# Patient Record
Sex: Female | Born: 1985 | Race: Black or African American | Hispanic: No | Marital: Single | Smoking: Current every day smoker
Health system: Southern US, Community
[De-identification: ages and names within clinical notes are randomized; demographics above are authoritative.]

## PROBLEM LIST (undated history)

## (undated) DIAGNOSIS — R0981 Nasal congestion: Secondary | ICD-10-CM

## (undated) DIAGNOSIS — I499 Cardiac arrhythmia, unspecified: Secondary | ICD-10-CM

## (undated) DIAGNOSIS — D241 Benign neoplasm of right breast: Secondary | ICD-10-CM

## (undated) DIAGNOSIS — Z87898 Personal history of other specified conditions: Secondary | ICD-10-CM

## (undated) DIAGNOSIS — R011 Cardiac murmur, unspecified: Secondary | ICD-10-CM

## (undated) HISTORY — PX: NO PAST SURGERIES: SHX2092

---

## 2014-02-09 ENCOUNTER — Other Ambulatory Visit: Payer: Self-pay | Admitting: Gynecology

## 2014-02-09 DIAGNOSIS — N6452 Nipple discharge: Secondary | ICD-10-CM

## 2014-02-24 ENCOUNTER — Other Ambulatory Visit: Payer: Self-pay | Admitting: Gynecology

## 2014-02-24 ENCOUNTER — Ambulatory Visit
Admission: RE | Admit: 2014-02-24 | Discharge: 2014-02-24 | Disposition: A | Discharge status: Home / Self Care | Payer: BC Managed Care – PPO | Source: Ambulatory Visit | Attending: Gynecology | Admitting: Gynecology

## 2014-02-24 DIAGNOSIS — N6452 Nipple discharge: Secondary | ICD-10-CM

## 2014-03-04 ENCOUNTER — Other Ambulatory Visit: Payer: Self-pay | Admitting: Gynecology

## 2014-03-04 ENCOUNTER — Ambulatory Visit
Admission: RE | Admit: 2014-03-04 | Discharge: 2014-03-04 | Disposition: A | Discharge status: Home / Self Care | Payer: BC Managed Care – PPO | Source: Ambulatory Visit | Attending: Gynecology | Admitting: Gynecology

## 2014-03-04 DIAGNOSIS — N6452 Nipple discharge: Secondary | ICD-10-CM

## 2014-03-18 DIAGNOSIS — D241 Benign neoplasm of right breast: Secondary | ICD-10-CM

## 2014-03-18 HISTORY — DX: Benign neoplasm of right breast: D24.1

## 2014-03-18 HISTORY — PX: BREAST EXCISIONAL BIOPSY: SUR124

## 2014-03-30 ENCOUNTER — Other Ambulatory Visit (INDEPENDENT_AMBULATORY_CARE_PROVIDER_SITE_OTHER): Payer: Self-pay | Admitting: General Surgery

## 2014-03-30 DIAGNOSIS — D241 Benign neoplasm of right breast: Secondary | ICD-10-CM

## 2014-04-07 ENCOUNTER — Other Ambulatory Visit (INDEPENDENT_AMBULATORY_CARE_PROVIDER_SITE_OTHER): Payer: Self-pay | Admitting: General Surgery

## 2014-04-07 DIAGNOSIS — D241 Benign neoplasm of right breast: Secondary | ICD-10-CM

## 2014-04-14 ENCOUNTER — Encounter (HOSPITAL_BASED_OUTPATIENT_CLINIC_OR_DEPARTMENT_OTHER): Payer: Self-pay | Admitting: *Deleted

## 2014-04-14 DIAGNOSIS — R0981 Nasal congestion: Secondary | ICD-10-CM

## 2014-04-14 HISTORY — DX: Nasal congestion: R09.81

## 2014-04-14 NOTE — Pre-Procedure Instructions (Signed)
To come for EKG 

## 2014-04-19 ENCOUNTER — Ambulatory Visit
Admission: RE | Admit: 2014-04-19 | Discharge: 2014-04-19 | Disposition: A | Discharge status: Home / Self Care | Payer: BLUE CROSS/BLUE SHIELD | Source: Ambulatory Visit | Attending: General Surgery | Admitting: General Surgery

## 2014-04-19 DIAGNOSIS — D241 Benign neoplasm of right breast: Secondary | ICD-10-CM

## 2014-04-20 ENCOUNTER — Encounter (HOSPITAL_BASED_OUTPATIENT_CLINIC_OR_DEPARTMENT_OTHER)
Admission: RE | Disposition: A | Discharge status: Home / Self Care | Payer: Self-pay | Source: Ambulatory Visit | Attending: General Surgery

## 2014-04-20 ENCOUNTER — Ambulatory Visit (HOSPITAL_BASED_OUTPATIENT_CLINIC_OR_DEPARTMENT_OTHER): Payer: BLUE CROSS/BLUE SHIELD | Admitting: Certified Registered"

## 2014-04-20 ENCOUNTER — Ambulatory Visit (HOSPITAL_BASED_OUTPATIENT_CLINIC_OR_DEPARTMENT_OTHER)
Admission: RE | Admit: 2014-04-20 | Discharge: 2014-04-20 | Disposition: A | Discharge status: Home / Self Care | Payer: BLUE CROSS/BLUE SHIELD | Source: Ambulatory Visit | Attending: General Surgery | Admitting: General Surgery

## 2014-04-20 ENCOUNTER — Ambulatory Visit
Admission: RE | Admit: 2014-04-20 | Discharge: 2014-04-20 | Disposition: A | Discharge status: Home / Self Care | Payer: BLUE CROSS/BLUE SHIELD | Source: Ambulatory Visit | Attending: General Surgery | Admitting: General Surgery

## 2014-04-20 ENCOUNTER — Encounter (HOSPITAL_BASED_OUTPATIENT_CLINIC_OR_DEPARTMENT_OTHER): Payer: Self-pay | Admitting: *Deleted

## 2014-04-20 DIAGNOSIS — G43909 Migraine, unspecified, not intractable, without status migrainosus: Secondary | ICD-10-CM | POA: Insufficient documentation

## 2014-04-20 DIAGNOSIS — D241 Benign neoplasm of right breast: Secondary | ICD-10-CM | POA: Insufficient documentation

## 2014-04-20 DIAGNOSIS — F17200 Nicotine dependence, unspecified, uncomplicated: Secondary | ICD-10-CM | POA: Diagnosis not present

## 2014-04-20 HISTORY — DX: Benign neoplasm of right breast: D24.1

## 2014-04-20 HISTORY — DX: Cardiac arrhythmia, unspecified: I49.9

## 2014-04-20 HISTORY — DX: Personal history of other specified conditions: Z87.898

## 2014-04-20 HISTORY — DX: Cardiac murmur, unspecified: R01.1

## 2014-04-20 HISTORY — DX: Nasal congestion: R09.81

## 2014-04-20 HISTORY — PX: BREAST SURGERY: SHX581

## 2014-04-20 HISTORY — PX: BREAST LUMPECTOMY WITH RADIOACTIVE SEED LOCALIZATION: SHX6424

## 2014-04-20 SURGERY — BREAST LUMPECTOMY WITH RADIOACTIVE SEED LOCALIZATION
Anesthesia: General | Site: Breast | Laterality: Right

## 2014-04-20 MED ORDER — ACETAMINOPHEN 325 MG PO TABS: 325.0000 mg | ORAL_TABLET | ORAL | Status: DC | PRN

## 2014-04-20 MED ORDER — CEFAZOLIN SODIUM-DEXTROSE 2-3 GM-% IV SOLR
2.0000 g | INTRAVENOUS | Status: AC
  Administered 2014-04-20: 2 g via INTRAVENOUS

## 2014-04-20 MED ORDER — MIDAZOLAM HCL 2 MG/2ML IJ SOLN: 1.0000 mg | INTRAMUSCULAR | Status: DC | PRN

## 2014-04-20 MED ORDER — BUPIVACAINE-EPINEPHRINE (PF) 0.25% -1:200000 IJ SOLN
INTRAMUSCULAR | Status: AC
  Filled 2014-04-20: qty 30

## 2014-04-20 MED ORDER — BUPIVACAINE HCL (PF) 0.25 % IJ SOLN
INTRAMUSCULAR | Status: AC
  Filled 2014-04-20: qty 30

## 2014-04-20 MED ORDER — CHLORHEXIDINE GLUCONATE 4 % EX LIQD: 1.0000 "application " | Freq: Once | CUTANEOUS | Status: DC

## 2014-04-20 MED ORDER — ONDANSETRON HCL 4 MG/2ML IJ SOLN
INTRAMUSCULAR | Status: DC | PRN
  Administered 2014-04-20: 4 mg via INTRAVENOUS

## 2014-04-20 MED ORDER — MIDAZOLAM HCL 2 MG/2ML IJ SOLN
INTRAMUSCULAR | Status: AC
  Filled 2014-04-20: qty 2

## 2014-04-20 MED ORDER — LACTATED RINGERS IV SOLN
INTRAVENOUS | Status: DC
  Administered 2014-04-20 (×2): via INTRAVENOUS

## 2014-04-20 MED ORDER — FENTANYL CITRATE 0.05 MG/ML IJ SOLN
INTRAMUSCULAR | Status: AC
  Filled 2014-04-20: qty 2

## 2014-04-20 MED ORDER — MIDAZOLAM HCL 5 MG/5ML IJ SOLN
INTRAMUSCULAR | Status: DC | PRN
  Administered 2014-04-20: 2 mg via INTRAVENOUS

## 2014-04-20 MED ORDER — OXYCODONE-ACETAMINOPHEN 5-325 MG PO TABS: 1.0000 | ORAL_TABLET | ORAL | Status: DC | PRN

## 2014-04-20 MED ORDER — PROPOFOL 10 MG/ML IV BOLUS
INTRAVENOUS | Status: DC | PRN
  Administered 2014-04-20: 40 mg via INTRAVENOUS
  Administered 2014-04-20: 140 mg via INTRAVENOUS

## 2014-04-20 MED ORDER — DEXAMETHASONE SODIUM PHOSPHATE 10 MG/ML IJ SOLN
INTRAMUSCULAR | Status: DC | PRN
  Administered 2014-04-20: 10 mg via INTRAVENOUS

## 2014-04-20 MED ORDER — ACETAMINOPHEN 160 MG/5ML PO SOLN: 325.0000 mg | ORAL | Status: DC | PRN

## 2014-04-20 MED ORDER — FENTANYL CITRATE 0.05 MG/ML IJ SOLN
INTRAMUSCULAR | Status: AC
  Filled 2014-04-20: qty 6

## 2014-04-20 MED ORDER — FENTANYL CITRATE 0.05 MG/ML IJ SOLN
25.0000 ug | INTRAMUSCULAR | Status: DC | PRN
  Administered 2014-04-20: 25 ug via INTRAVENOUS
  Administered 2014-04-20: 50 ug via INTRAVENOUS
  Administered 2014-04-20: 25 ug via INTRAVENOUS

## 2014-04-20 MED ORDER — MIDAZOLAM HCL 2 MG/ML PO SYRP: 12.0000 mg | ORAL_SOLUTION | Freq: Once | ORAL | Status: DC | PRN

## 2014-04-20 MED ORDER — OXYCODONE HCL 5 MG/5ML PO SOLN
5.0000 mg | Freq: Once | ORAL | Status: DC | PRN
Start: 2014-04-20 — End: 2014-04-20

## 2014-04-20 MED ORDER — BUPIVACAINE HCL (PF) 0.25 % IJ SOLN
INTRAMUSCULAR | Status: DC | PRN
  Administered 2014-04-20: 10 mL

## 2014-04-20 MED ORDER — CEFAZOLIN SODIUM 1-5 GM-% IV SOLN
INTRAVENOUS | Status: AC
  Filled 2014-04-20: qty 100

## 2014-04-20 MED ORDER — FENTANYL CITRATE 0.05 MG/ML IJ SOLN
INTRAMUSCULAR | Status: DC | PRN
  Administered 2014-04-20 (×2): 50 ug via INTRAVENOUS

## 2014-04-20 MED ORDER — OXYCODONE HCL 5 MG PO TABS: 5.0000 mg | ORAL_TABLET | Freq: Once | ORAL | Status: DC | PRN

## 2014-04-20 MED ORDER — LIDOCAINE HCL (CARDIAC) 20 MG/ML IV SOLN
INTRAVENOUS | Status: DC | PRN
  Administered 2014-04-20: 60 mg via INTRAVENOUS

## 2014-04-20 MED ORDER — FENTANYL CITRATE 0.05 MG/ML IJ SOLN: 50.0000 ug | INTRAMUSCULAR | Status: DC | PRN

## 2014-04-20 SURGICAL SUPPLY — 39 items
APPLIER CLIP 9.375 MED OPEN (MISCELLANEOUS) ×2
BLADE SURG 15 STRL LF DISP TIS (BLADE) ×1 IMPLANT
BLADE SURG 15 STRL SS (BLADE) ×1
CANISTER SUC SOCK COL 7IN (MISCELLANEOUS) IMPLANT
CANISTER SUCT 1200ML W/VALVE (MISCELLANEOUS) IMPLANT
CHLORAPREP W/TINT 26ML (MISCELLANEOUS) ×2 IMPLANT
CLIP APPLIE 9.375 MED OPEN (MISCELLANEOUS) ×1 IMPLANT
COVER BACK TABLE 60X90IN (DRAPES) ×2 IMPLANT
COVER MAYO STAND STRL (DRAPES) ×2 IMPLANT
COVER PROBE W GEL 5X96 (DRAPES) ×2 IMPLANT
DECANTER SPIKE VIAL GLASS SM (MISCELLANEOUS) IMPLANT
DEVICE DUBIN W/COMP PLATE 8390 (MISCELLANEOUS) ×2 IMPLANT
DRAPE LAPAROSCOPIC ABDOMINAL (DRAPES) ×2 IMPLANT
DRAPE UTILITY XL STRL (DRAPES) ×2 IMPLANT
ELECT COATED BLADE 2.86 ST (ELECTRODE) ×2 IMPLANT
ELECT REM PT RETURN 9FT ADLT (ELECTROSURGICAL) ×2
ELECTRODE REM PT RTRN 9FT ADLT (ELECTROSURGICAL) ×1 IMPLANT
GLOVE BIO SURGEON STRL SZ7.5 (GLOVE) ×4 IMPLANT
GLOVE BIOGEL PI IND STRL 7.0 (GLOVE) ×1 IMPLANT
GLOVE BIOGEL PI INDICATOR 7.0 (GLOVE) ×1
GLOVE ECLIPSE 6.5 STRL STRAW (GLOVE) ×2 IMPLANT
GOWN STRL REUS W/ TWL LRG LVL3 (GOWN DISPOSABLE) ×2 IMPLANT
GOWN STRL REUS W/TWL LRG LVL3 (GOWN DISPOSABLE) ×2
KIT MARKER MARGIN INK (KITS) IMPLANT
LIQUID BAND (GAUZE/BANDAGES/DRESSINGS) ×2 IMPLANT
NEEDLE HYPO 25X1 1.5 SAFETY (NEEDLE) ×2 IMPLANT
NS IRRIG 1000ML POUR BTL (IV SOLUTION) ×2 IMPLANT
PACK BASIN DAY SURGERY FS (CUSTOM PROCEDURE TRAY) ×2 IMPLANT
PENCIL BUTTON HOLSTER BLD 10FT (ELECTRODE) ×2 IMPLANT
SLEEVE SCD COMPRESS KNEE MED (MISCELLANEOUS) ×2 IMPLANT
SPONGE LAP 18X18 X RAY DECT (DISPOSABLE) ×2 IMPLANT
SUT MON AB 4-0 PC3 18 (SUTURE) ×2 IMPLANT
SUT SILK 2 0 SH (SUTURE) ×2 IMPLANT
SUT VICRYL 3-0 CR8 SH (SUTURE) ×2 IMPLANT
SYR CONTROL 10ML LL (SYRINGE) ×2 IMPLANT
TOWEL OR 17X24 6PK STRL BLUE (TOWEL DISPOSABLE) ×2 IMPLANT
TOWEL OR NON WOVEN STRL DISP B (DISPOSABLE) ×2 IMPLANT
TUBE CONNECTING 20X1/4 (TUBING) IMPLANT
YANKAUER SUCT BULB TIP NO VENT (SUCTIONS) IMPLANT

## 2014-04-20 NOTE — Interval H&P Note (Signed)
History and Physical Interval Note:  04/20/2014 7:57 AM  Allison Juarez  has presented today for surgery, with the diagnosis of Right Breast Papilloma  The various methods of treatment have been discussed with the patient and family. After consideration of risks, benefits and other options for treatment, the patient has consented to  Procedure(s): RIGHT BREAST LUMPECTOMY WITH RADIOACTIVE SEED LOCALIZATION (Right) as a surgical intervention .  The patient's history has been reviewed, patient examined, no change in status, stable for surgery.  I have reviewed the patient's chart and labs.  Questions were answered to the patient's satisfaction.     TOTH III,Fabricio Endsley S

## 2014-04-20 NOTE — Transfer of Care (Signed)
Immediate Anesthesia Transfer of Care Note  Patient: Allison Juarez  Procedure(s) Performed: Procedure(s): RIGHT BREAST LUMPECTOMY WITH RADIOACTIVE SEED LOCALIZATION (Right)  Patient Location: PACU  Anesthesia Type:General  Level of Consciousness: awake, alert , oriented and patient cooperative  Airway & Oxygen Therapy: Patient Spontanous Breathing and Patient connected to face mask oxygen  Post-op Assessment: Report given to RN and Post -op Vital signs reviewed and stable  Post vital signs: Reviewed and stable  Last Vitals:  Filed Vitals:   04/20/14 0929  BP:   Pulse: 89  Temp:   Resp:     Complications: No apparent anesthesia complications

## 2014-04-20 NOTE — Op Note (Signed)
04/20/2014  9:20 AM  PATIENT:  Allison Juarez  29 y.o. female  PRE-OPERATIVE DIAGNOSIS:  Right Breast Papilloma  POST-OPERATIVE DIAGNOSIS:  right breast papilloma  PROCEDURE:  Procedure(s): RIGHT BREAST LUMPECTOMY WITH RADIOACTIVE SEED LOCALIZATION (Right)  SURGEON:  Surgeon(s) and Role:    * Jovita Kussmaul, MD - Primary  PHYSICIAN ASSISTANT:   ASSISTANTS: none   ANESTHESIA:   general  EBL:  Total I/O In: 1000 [I.V.:1000] Out: -   BLOOD ADMINISTERED:none  DRAINS: none   LOCAL MEDICATIONS USED:  MARCAINE     SPECIMEN:  Source of Specimen:  right breast tissue  DISPOSITION OF SPECIMEN:  PATHOLOGY  COUNTS:  YES  TOURNIQUET:  * No tourniquets in log *  DICTATION: .Dragon Dictation  After informed consent was obtained the patient was brought to the operating room and placed in the supine position on the operating table. After adequate induction of general anesthesia the patient's right breast was prepped with ChloraPrep, allowed to dry, and draped in usual sterile manner. Previously a radioactive I-125 seed had been placed in the lower inner quadrant at the edge of the areola to mark an area of a papilloma. The neoprobe was set to I-125 and we were able to identify the area of radioactivity in the lower inner right breast. A curvilinear incision was made at the edge of the areola with a 15 blade knife. The incision was carried through the skin and subcutaneous tissue sharply with the electrocautery. The neoprobe was used to frequently checked the position of the radioactivity. A circular portion of breast tissue was excised sharply with electrocautery around the area of radioactivity. Once the specimen was removed it was oriented with a short single stitch on the superior surface, a long single stitch on the lateral surface, and a long double stitch on the anterior surface. Radioactivity was confirmed in the specimen. There was no residual radioactivity left in the breast. A specimen  radiograph was obtained that showed the clip and seed to be in the center of the specimen. The specimen was then sent to pathology for further evaluation. Hemostasis was achieved using the Bovie electrocautery. The wound was infiltrated with quarter percent Marcaine and irrigated with copious amounts of saline. The deep layer of the wound was closed with interrupted 3-0 Vicryl stitches. The skin was then closed with interrupted 4-0 Monocryl subcuticular stitches. Dermabond dressings were applied. The patient tolerated the procedure well. At the end of the case all needle sponge and instrument counts were correct. The patient was then awakened and taken to recovery in stable condition.  PLAN OF CARE: Discharge to home after PACU  PATIENT DISPOSITION:  PACU - hemodynamically stable.   Delay start of Pharmacological VTE agent (>24hrs) due to surgical blood loss or risk of bleeding: not applicable

## 2014-04-20 NOTE — Anesthesia Preprocedure Evaluation (Addendum)
Anesthesia Evaluation  Patient identified by MRN, date of birth, ID band Patient awake    Reviewed: Allergy & Precautions, NPO status , Patient's Chart, lab work & pertinent test results  History of Anesthesia Complications Negative for: history of anesthetic complications  Airway Mallampati: I  TM Distance: >3 FB Neck ROM: Full    Dental  (+) Teeth Intact   Pulmonary neg sleep apnea, neg COPDneg recent URI, Current Smoker,  breath sounds clear to auscultation        Cardiovascular negative cardio ROS  Rhythm:Regular     Neuro/Psych negative neurological ROS  negative psych ROS   GI/Hepatic negative GI ROS, Neg liver ROS,   Endo/Other  negative endocrine ROS  Renal/GU negative Renal ROS     Musculoskeletal negative musculoskeletal ROS (+)   Abdominal   Peds  Hematology negative hematology ROS (+)   Anesthesia Other Findings   Reproductive/Obstetrics                           Anesthesia Physical Anesthesia Plan  ASA: II  Anesthesia Plan: General   Post-op Pain Management:    Induction: Intravenous  Airway Management Planned: LMA  Additional Equipment: None  Intra-op Plan:   Post-operative Plan: Extubation in OR  Informed Consent: I have reviewed the patients History and Physical, chart, labs and discussed the procedure including the risks, benefits and alternatives for the proposed anesthesia with the patient or authorized representative who has indicated his/her understanding and acceptance.   Dental advisory given  Plan Discussed with: CRNA and Surgeon  Anesthesia Plan Comments:         Anesthesia Quick Evaluation

## 2014-04-20 NOTE — Anesthesia Postprocedure Evaluation (Signed)
  Anesthesia Post-op Note  Patient: Allison Juarez  Procedure(s) Performed: Procedure(s): RIGHT BREAST LUMPECTOMY WITH RADIOACTIVE SEED LOCALIZATION (Right)  Patient Location: PACU  Anesthesia Type: General   Level of Consciousness: awake, alert  and oriented  Airway and Oxygen Therapy: Patient Spontanous Breathing  Post-op Pain: mild  Post-op Assessment: Post-op Vital signs reviewed  Post-op Vital Signs: Reviewed  Last Vitals:  Filed Vitals:   04/20/14 1015  BP: 115/78  Pulse: 59  Temp:   Resp: 15    Complications: No apparent anesthesia complications

## 2014-04-20 NOTE — Discharge Instructions (Signed)

## 2014-04-20 NOTE — Anesthesia Procedure Notes (Addendum)
Procedure Name: LMA Insertion Performed by: Zechariah Bissonnette Pre-anesthesia Checklist: Patient identified, Emergency Drugs available, Suction available and Patient being monitored Patient Re-evaluated:Patient Re-evaluated prior to inductionOxygen Delivery Method: Circle System Utilized Preoxygenation: Pre-oxygenation with 100% oxygen Intubation Type: IV induction Ventilation: Mask ventilation without difficulty LMA: LMA inserted LMA Size: 4.0 Number of attempts: 1 Airway Equipment and Method: Bite block Placement Confirmation: positive ETCO2 Tube secured with: Tape Dental Injury: Teeth and Oropharynx as per pre-operative assessment

## 2014-04-20 NOTE — H&P (Signed)
Allison Juarez 03/30/2014 2:07 PM Location: [facility] Patient #: [id number] DOB: May 04, 1985 Married / Language: English / Race: Black or African American Female  History of Present Illness Sammuel Hines. Marlou Starks MD; 03/30/2014 2:33 PM) Patient words: breast papiloma.  The patient is a 29 year old female who presents with a breast mass. We are asked to see the patient in consultation by Dr. Pamelia Hoit to evaluate her for a papilloma of the right breast. The patient is a 29 year old black female who has been experiencing bloody nipple discharge from the right breast for the last 10 months. The discharge occurs spontaneously on a daily basis. She denies any breast pain. She does not take any female hormones. She does have a history of breast cancer in some aunts. She was evaluated with ultrasound that did show an intraductal papilloma measuring about 5-6 mm in the 3 o'clock position of the right breast   Other Problems Briant Cedar, CMA; 03/30/2014 2:07 PM) Heart murmur Migraine Headache Other disease, cancer, significant illness  Past Surgical History Briant Cedar, [place]; 03/30/2014 2:07 PM) Oral Surgery  Diagnostic Studies History Briant Cedar, CMA; 03/30/2014 2:07 PM) Colonoscopy never Mammogram within last year Pap Smear 1-5 years ago  Allergies Briant Cedar, [place]; 03/30/2014 2:10 PM) No Known Drug Allergies01/13/2016  Medication History Briant Cedar, [place]; 03/30/2014 2:10 PM) No Current Medications  Social History Briant Cedar, [place]; 03/30/2014 2:07 PM) Alcohol use Occasional alcohol use. Caffeine use Coffee. Illicit drug use Prefer to discuss with provider. Tobacco use Current some day smoker.  Family History Briant Cedar, [place]; 03/30/2014 2:07 PM) Alcohol Abuse Father, Mother. Arthritis Family Members In General. Breast Cancer Family Members In General. Diabetes Mellitus Father. Heart Disease Mother. Heart disease in female  family member before age 66 Hypertension Father, Mother. Migraine Headache Mother. Respiratory Condition Family Members In General.  Pregnancy / Birth History Briant Cedar, [place]; 03/30/2014 2:07 PM) Age at menarche 19 years. Gravida 0 Para 0 Regular periods  Review of Systems Briant Cedar CMA; 03/30/2014 2:07 PM) General Present- Fever and Night Sweats. Not Present- Appetite Loss, Chills, Fatigue, Weight Gain and Weight Loss. HEENT Present- Seasonal Allergies and Wears glasses/contact lenses. Not Present- Earache, Hearing Loss, Hoarseness, Nose Bleed, Oral Ulcers, Ringing in the Ears, Sinus Pain, Sore Throat, Visual Disturbances and Yellow Eyes. Respiratory Not Present- Bloody sputum, Chronic Cough, Difficulty Breathing, Snoring and Wheezing. Breast Present- Nipple Discharge. Not Present- Breast Mass, Breast Pain and Skin Changes. Cardiovascular Present- Rapid Heart Rate. Not Present- Chest Pain, Difficulty Breathing Lying Down, Leg Cramps, Palpitations, Shortness of Breath and Swelling of Extremities. Gastrointestinal Present- Change in Bowel Habits. Not Present- Abdominal Pain, Bloating, Bloody Stool, Chronic diarrhea, Constipation, Difficulty Swallowing, Excessive gas, Gets full quickly at meals, Hemorrhoids, Indigestion, Nausea, Rectal Pain and Vomiting. Female Genitourinary Not Present- Frequency, Nocturia, Painful Urination, Pelvic Pain and Urgency. Musculoskeletal Present- Joint Pain. Not Present- Back Pain, Joint Stiffness, Muscle Pain, Muscle Weakness and Swelling of Extremities. Neurological Present- Fainting. Not Present- Decreased Memory, Headaches, Numbness, Seizures, Tingling, Tremor, Trouble walking and Weakness. Psychiatric Not Present- Anxiety, Bipolar, Change in Sleep Pattern, Depression, Fearful and Frequent crying. Hematology Not Present- Easy Bruising, Excessive bleeding, Gland problems, HIV and Persistent Infections.   Vitals Briant Cedar CMA; 03/30/2014  2:11 PM) 03/30/2014 2:10 PM Weight: 127 lb Height: 63in Body Surface Area: 1.6 m Body Mass Index: 22.5 kg/m Temp.: 61F  Pulse: 94 (Regular)  BP: 102/70 (Sitting, Left Arm, Standard)    Physical Exam Eddie Dibbles S. Marlou Starks MD;  03/30/2014 2:34 PM) General Mental Status-Alert. General Appearance-Consistent with stated age. Hydration-Well hydrated. Voice-Normal.  Head and Neck Head-normocephalic, atraumatic with no lesions or palpable masses. Trachea-midline. Thyroid Gland Characteristics - normal size and consistency.  Eye Eyeball - Bilateral-Extraocular movements intact. Sclera/Conjunctiva - Bilateral-No scleral icterus.  Chest and Lung Exam Chest and lung exam reveals -quiet, even and easy respiratory effort with no use of accessory muscles and on auscultation, normal breath sounds, no adventitious sounds and normal vocal resonance. Inspection Chest Wall - Normal. Back - normal.  Breast Breast - Left-Symmetric, Non Tender, No Biopsy scars, no Dimpling, No Inflammation, No Lumpectomy scars, No Mastectomy scars, No Peau d' Orange. Breast - Right-Symmetric, Non Tender, No Biopsy scars, no Dimpling, No Inflammation, No Lumpectomy scars, No Mastectomy scars, No Peau d' Orange. Breast Lump-No Palpable Breast Mass.  Cardiovascular Cardiovascular examination reveals -normal heart sounds, regular rate and rhythm with no murmurs and normal pedal pulses bilaterally.  Abdomen Inspection Inspection of the abdomen reveals - No Hernias. Skin - Scar - no surgical scars. Palpation/Percussion Palpation and Percussion of the abdomen reveal - Soft, Non Tender, No Rebound tenderness, No Rigidity (guarding) and No hepatosplenomegaly. Auscultation Auscultation of the abdomen reveals - Bowel sounds normal.  Neurologic Neurologic evaluation reveals -alert and oriented x 3 with no impairment of recent or remote memory. Mental  Status-Normal.  Musculoskeletal Normal Exam - Left-Upper Extremity Strength Normal and Lower Extremity Strength Normal. Normal Exam - Right-Upper Extremity Strength Normal and Lower Extremity Strength Normal.  Lymphatic Head & Neck  General Head & Neck Lymphatics: Bilateral - Description - Normal. Axillary  General Axillary Region: Bilateral - Description - Normal. Tenderness - Non Tender. Femoral & Inguinal  Generalized Femoral & Inguinal Lymphatics: Bilateral - Description - Normal. Tenderness - Non Tender.    Assessment & Plan Eddie Dibbles S. 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 Because of this can increase the risk of breast cancer during her lifetime and because of the abnormal growth within the duct and the bloody discharge, the recommendation would be to have this area of papilloma removed. I have discussed with her in detail the risks and benefits of the operation to remove the papilloma as well as some of the technical aspects and she understands and wishes to proceed. Plan for radioactive seed localized lumpectomy of the right breast     Signed by Luella Cook, MD (03/30/2014 2:34 PM)

## 2014-04-21 ENCOUNTER — Encounter (HOSPITAL_BASED_OUTPATIENT_CLINIC_OR_DEPARTMENT_OTHER): Payer: Self-pay | Admitting: General Surgery

## 2015-06-01 IMAGING — MG MM RT RADIOACTIVE SEED LOC MAMMO GUIDE
8 of 10 series · 8 of 10 positions shown · non-contrast
Comparison: Previous exam(s).

CLINICAL DATA: Patient with prior biopsy right breast demonstrating
intraductal papilloma. For preoperative localization prior to
surgical excision.

EXAM:
MAMMOGRAPHIC GUIDED RADIOACTIVE SEED LOCALIZATION OF THE RIGHT
BREAST

[R ML (1 of 5)]
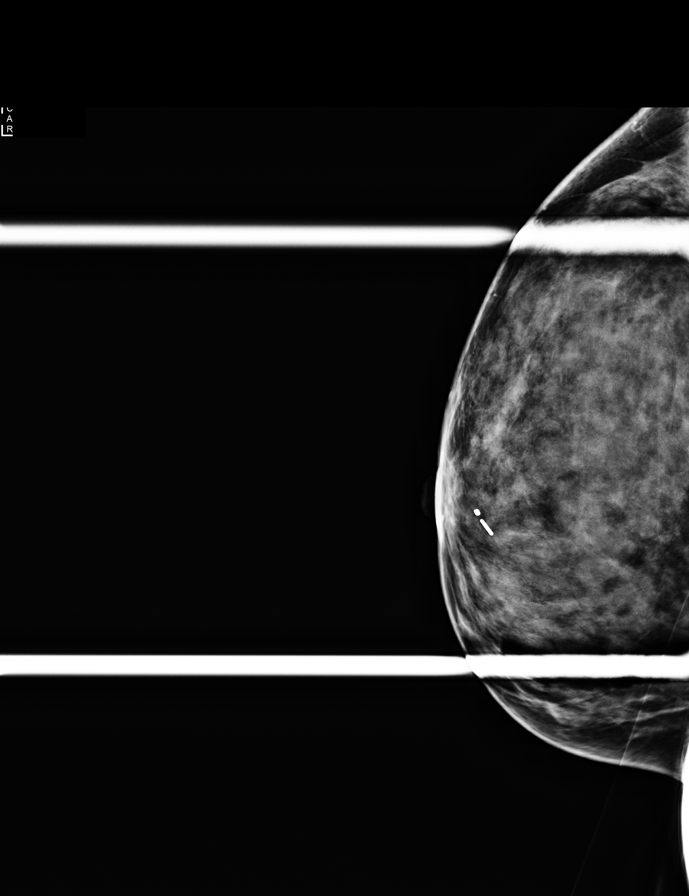

[R CC (1 of 3)]
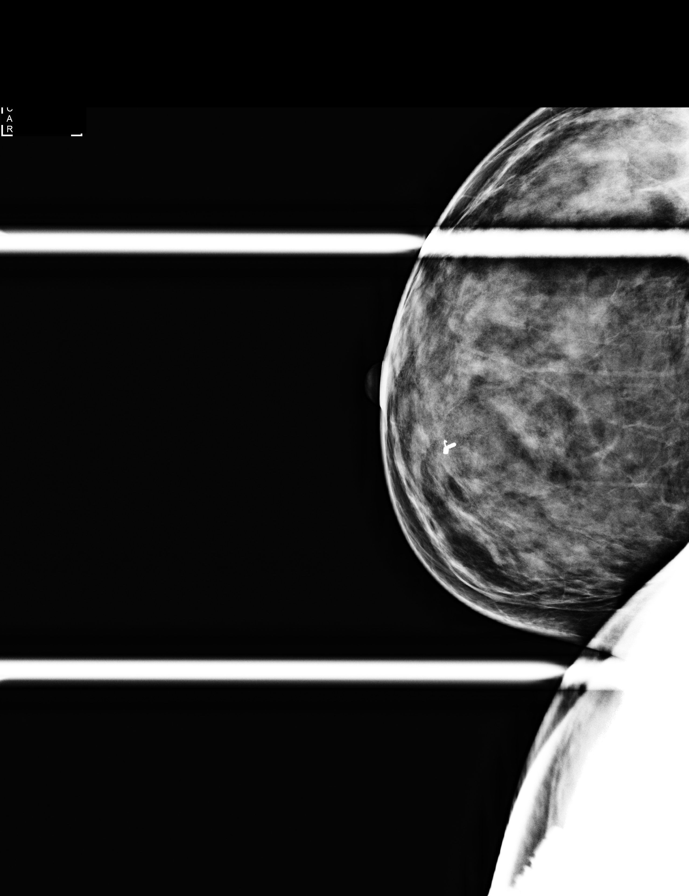

[R ML (2 of 5)]
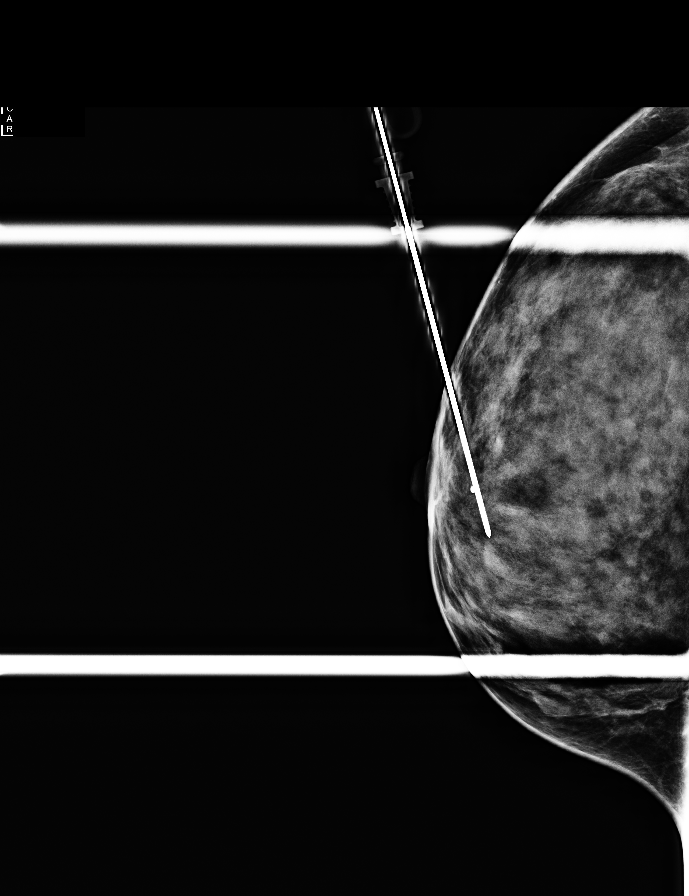

[R ML (3 of 5)]
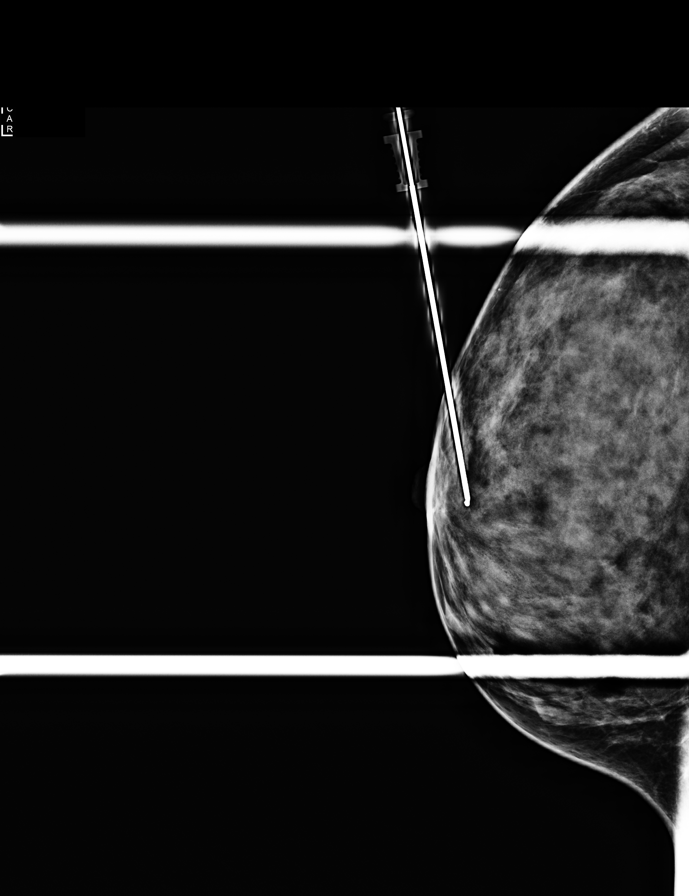

[R ML (4 of 5)]
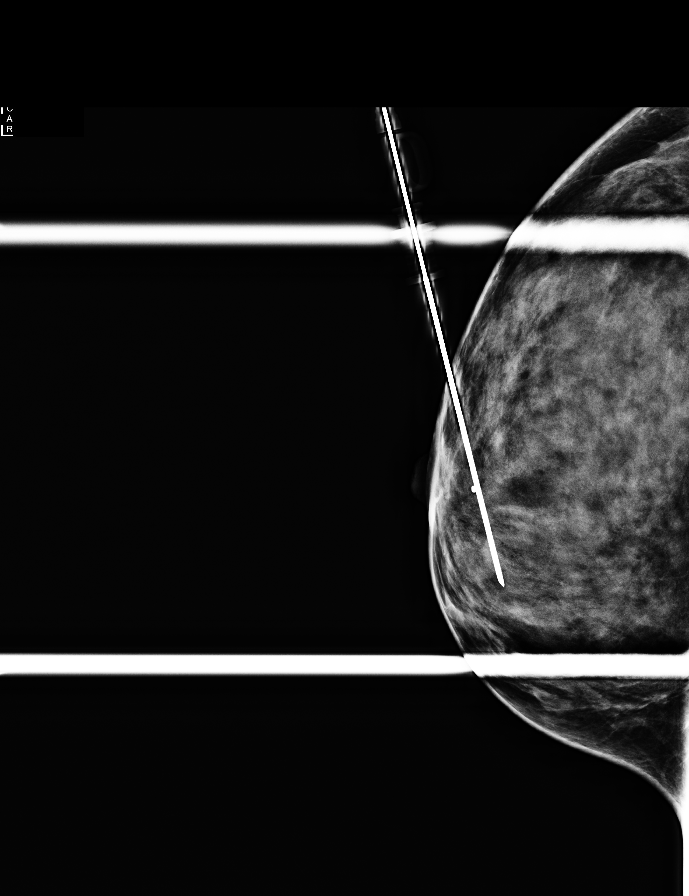

[R CC (2 of 3)]
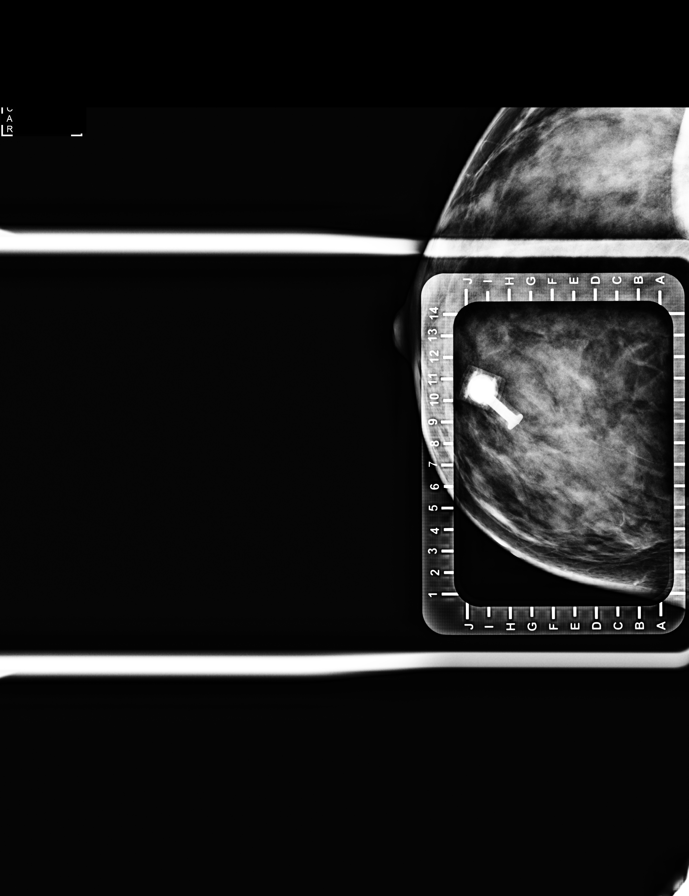

[R CC (3 of 3)]
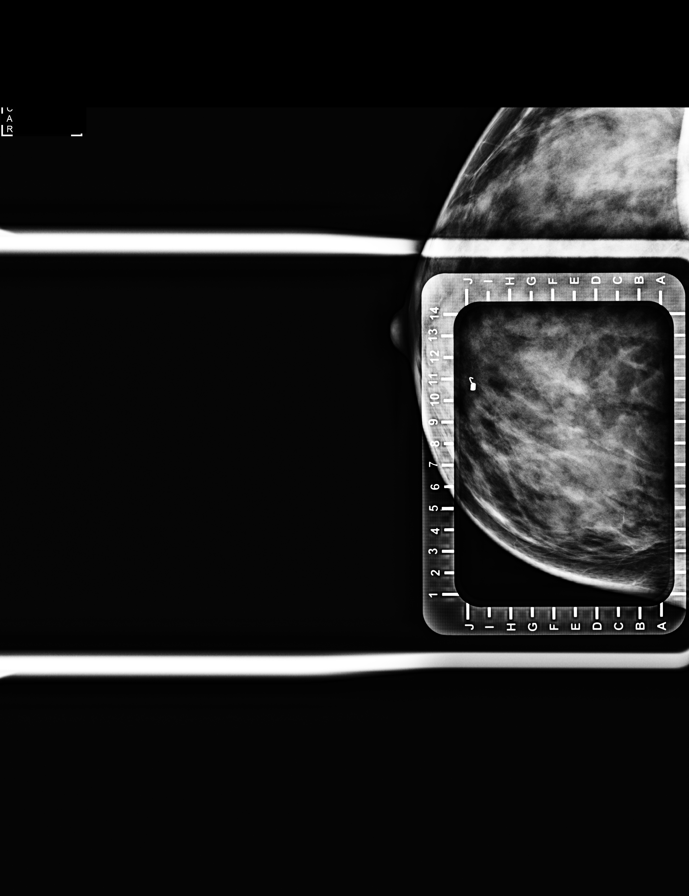

[R ML (5 of 5)]
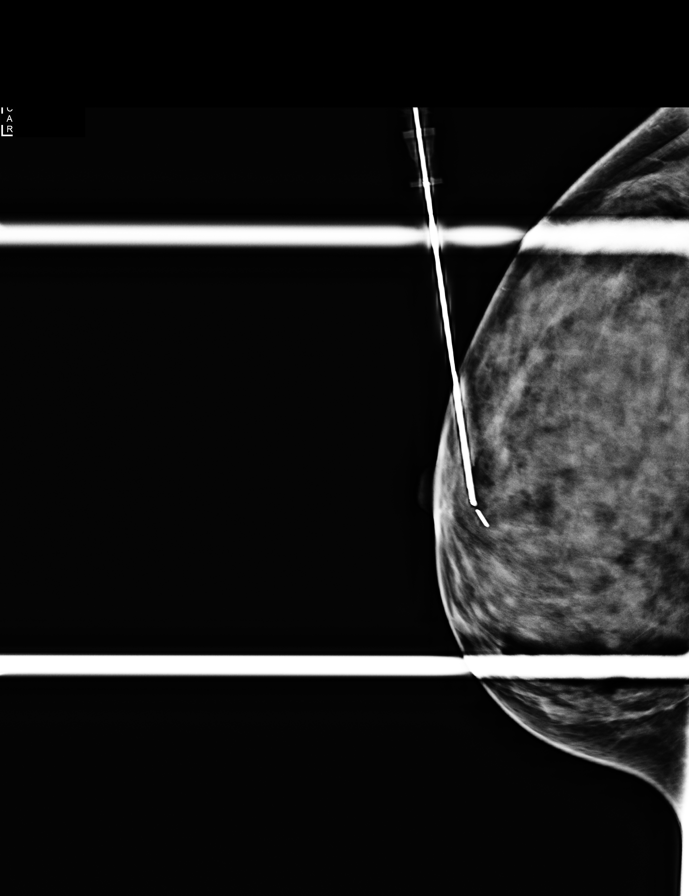

[8 of 10 positions shown; findings below may reference images not displayed]

FINDINGS: Patient presents for radioactive seed localization prior to surgical
excision of a right breast papilloma. I met with the patient and we
discussed the procedure of seed localization including benefits and
alternatives. We discussed the high likelihood of a successful
procedure. We discussed the risks of the procedure including
infection, bleeding, tissue injury and further surgery. We discussed
the low dose of radioactivity involved in the procedure. Informed,
written consent was given.

The usual time-out protocol was performed immediately prior to the
procedure.

Using mammographic guidance, sterile technique, 2% lidocaine and an
D-VAV radioactive seed, the coil shaped marking clip was localized
using a cranial approach. The follow-up mammogram images confirm the
seed in the expected location and are marked for Dr. Leon.

Follow-up survey of the patient confirms presence of the radioactive
seed.

Order number of D-VAV seed:  [id number].

Total activity:  0.265 mCi  Reference Date: 04/04/2014

The patient tolerated the procedure well and was released from the
[REDACTED]. She was given instructions regarding seed removal.
IMPRESSION: Radioactive seed localization right breast. No apparent
complications.

## 2016-11-26 ENCOUNTER — Emergency Department (HOSPITAL_COMMUNITY): Payer: Self-pay

## 2016-11-26 ENCOUNTER — Encounter (HOSPITAL_COMMUNITY): Payer: Self-pay

## 2016-11-26 ENCOUNTER — Emergency Department (HOSPITAL_COMMUNITY)
Admission: EM | Admit: 2016-11-26 | Discharge: 2016-11-26 | Disposition: A | Discharge status: Home / Self Care | Payer: Self-pay | Attending: Emergency Medicine | Admitting: Emergency Medicine

## 2016-11-26 DIAGNOSIS — M25561 Pain in right knee: Secondary | ICD-10-CM | POA: Insufficient documentation

## 2016-11-26 DIAGNOSIS — M2550 Pain in unspecified joint: Secondary | ICD-10-CM | POA: Insufficient documentation

## 2016-11-26 DIAGNOSIS — G8929 Other chronic pain: Secondary | ICD-10-CM

## 2016-11-26 DIAGNOSIS — R262 Difficulty in walking, not elsewhere classified: Secondary | ICD-10-CM | POA: Insufficient documentation

## 2016-11-26 DIAGNOSIS — F17290 Nicotine dependence, other tobacco product, uncomplicated: Secondary | ICD-10-CM | POA: Insufficient documentation

## 2016-11-26 DIAGNOSIS — R200 Anesthesia of skin: Secondary | ICD-10-CM | POA: Insufficient documentation

## 2016-11-26 DIAGNOSIS — M791 Myalgia: Secondary | ICD-10-CM | POA: Insufficient documentation

## 2016-11-26 MED ORDER — ACETAMINOPHEN 325 MG PO TABS
325.0000 mg | ORAL_TABLET | Freq: Once | ORAL | Status: AC
  Administered 2016-11-26: 325 mg via ORAL
  Filled 2016-11-26: qty 1

## 2016-11-26 MED ORDER — OXYCODONE-ACETAMINOPHEN 5-325 MG PO TABS
1.0000 | ORAL_TABLET | Freq: Once | ORAL | Status: AC
  Administered 2016-11-26: 1 via ORAL
  Filled 2016-11-26: qty 1

## 2016-11-26 MED ORDER — OXYCODONE HCL 5 MG PO TABS: 5.0000 mg | ORAL_TABLET | Freq: Four times a day (QID) | ORAL | 0 refills | Status: AC | PRN

## 2016-11-26 NOTE — Progress Notes (Signed)
Orthopedic Tech Progress Note Patient Details:  Allison Juarez 1985/10/29 [id number]  Ortho Devices Type of Ortho Device: Crutches, Knee Sleeve Ortho Device/Splint Location: RLE Ortho Device/Splint Interventions: Ordered, Application, Adjustment   Braulio Bosch 11/26/2016, 9:51 PM
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
## 2016-11-26 NOTE — Discharge Instructions (Signed)
You were evaluated in the emergency department for right knee pain.   On exam you had tenderness over your lateral ligament and meniscus.    Your knee x-rays were negative, no fractures, dislocations, fluid.  I suspect you may have injured a ligament or meniscus.   You need to follow up with either a primary care provider [facility]) or orthopedist, if able. If your pain continues you may need an MRI and possibly surgery.  Wear your knee sleeve and use your crutches to avoid any activities that worsen your pain for the next 2 days. Ice, elevate and take anti-inflammatories.  Take 600 mg ibuprofen + 1000 mg tylenol every 8 hours for the next 3-5 days. For severe, break through pain only, take oxycodone 5 mg. After 2 days, you should start to do light flexion and extension movements with your knee to avoid joint stiffness and loss of range of motion.  [facility] to establish care with a primary care doctor. Contact [facility] to establish care with a primary care provider for regular, routine medical care.  This clinic accepts patients without medical insurance. A primary care provider can adjust your daily medications and give you refills.

## 2016-11-26 NOTE — ED Notes (Signed)
Patient transported to X-ray 

## 2016-11-26 NOTE — ED Triage Notes (Signed)
Pt reports right knee pain that shoots up thigh and down calf. PT reports numbness at times. Pt states she has old injury to that knee when she was 44 and its been getting worse x last several months.

## 2018-09-28 ENCOUNTER — Other Ambulatory Visit: Payer: Self-pay

## 2018-09-28 ENCOUNTER — Other Ambulatory Visit: Payer: Self-pay | Admitting: *Deleted

## 2018-09-28 DIAGNOSIS — N6310 Unspecified lump in the right breast, unspecified quadrant: Secondary | ICD-10-CM

## 2018-09-28 DIAGNOSIS — N644 Mastodynia: Secondary | ICD-10-CM

## 2018-10-05 ENCOUNTER — Other Ambulatory Visit (HOSPITAL_COMMUNITY): Payer: Self-pay | Admitting: *Deleted

## 2018-10-05 DIAGNOSIS — N644 Mastodynia: Secondary | ICD-10-CM

## 2018-10-27 ENCOUNTER — Other Ambulatory Visit: Payer: Self-pay

## 2018-10-27 ENCOUNTER — Ambulatory Visit
Admission: RE | Admit: 2018-10-27 | Discharge: 2018-10-27 | Disposition: A | Discharge status: Home / Self Care | Payer: No Typology Code available for payment source | Source: Ambulatory Visit | Attending: Obstetrics and Gynecology | Admitting: Obstetrics and Gynecology

## 2018-10-27 ENCOUNTER — Ambulatory Visit (HOSPITAL_COMMUNITY)
Admission: RE | Admit: 2018-10-27 | Discharge: 2018-10-27 | Disposition: A | Discharge status: Home / Self Care | Payer: Self-pay | Source: Ambulatory Visit | Attending: Obstetrics and Gynecology | Admitting: Obstetrics and Gynecology

## 2018-10-27 ENCOUNTER — Encounter (HOSPITAL_COMMUNITY): Payer: Self-pay

## 2018-10-27 DIAGNOSIS — N644 Mastodynia: Secondary | ICD-10-CM

## 2018-10-27 DIAGNOSIS — N6315 Unspecified lump in the right breast, overlapping quadrants: Secondary | ICD-10-CM

## 2018-10-27 DIAGNOSIS — Z1239 Encounter for other screening for malignant neoplasm of breast: Secondary | ICD-10-CM | POA: Insufficient documentation

## 2018-10-27 DIAGNOSIS — N6452 Nipple discharge: Secondary | ICD-10-CM

## 2018-10-27 NOTE — Progress Notes (Signed)
Complaints of right breast pain x 6 months that comes and goes. Patient rates the pain at a 10 out of 10. Complaints of a yellowish spontaneous right breast discharge x 6 months. Complaints of a right breast lump for over a year.   Pap Smear: Pap smear not completed today. Last Pap smear was in October 2019 in [place] and normal per patient. Per patient has no history of an abnormal Pap smear. No Pap smear results are in Epic.  Physical exam: Breasts Breasts symmetrical. No skin abnormalities bilateral breasts. No nipple retraction bilateral breasts. No nipple discharge left breast. Expressed a clear colored discharge from the right breast on exam. Sample of discharge sent to Cytology for evaluation. No lymphadenopathy. No lumps palpated left breast. Palpated a mobile lump within the right breast at 3 o'clock next to the areola. Complaints of tenderness when palpated right breast lump. Referred patient to the [place] for a diagnostic mammogram and right breast ultrasound. Appointment scheduled for Tuesday, October 27, 2018 at 1520.        Pelvic/Bimanual No Pap smear completed today since last Pap smear was in October 2019 per patient. Pap smear not indicated per BCCCP guidelines.   Smoking History: Patient is a current smoker. Discussed smoking cessation with patient. Referred to the [facility] Quitline and gave resources to the free smoking cessation classes at [facility].  Patient Navigation: Patient education provided. Access to services provided for patient through BCCCP program.   Breast and Cervical Cancer Risk Assessment: Patient has a family history of a paternal aunt and maternal aunt having breast cancer. Patient has no known genetic mutations or history of radiation treatment to the chest before age 66. Patient has no history of cervical dysplasia, immunocompromised, or DES exposure in-utero. Breast cancer risk assessment completed. No breast cancer risk calculated due to  patient is less than 42 years old.

## 2018-10-27 NOTE — Patient Instructions (Signed)
Explained breast self awareness with Allison Juarez. Patient did not need a Pap smear today due to last Pap smear was in October 2019 per patient. Let her know BCCCP will cover Pap smears every 3 years unless has a history of abnormal Pap smears. Referred patient to the [place] for a diagnostic mammogram and right breast ultrasound. Appointment scheduled for Tuesday, October 27, 2018 at 1520. Patient aware of appointment and will be there. Discussed smoking cessation with patient. Referred to the [facility] Quitline and gave resources to the free smoking cessation classes at [facility]. Allison Juarez verbalized understanding.  Allison Juarez, Arvil Chaco, RN 3:14 PM

## 2018-11-02 ENCOUNTER — Telehealth (HOSPITAL_COMMUNITY): Payer: Self-pay | Admitting: *Deleted

## 2018-11-02 NOTE — Telephone Encounter (Signed)
Called patient and let her know that no malignant cells were identified within her right breast discharge. Patient verbalized understanding.

## 2018-11-03 ENCOUNTER — Encounter (HOSPITAL_COMMUNITY): Payer: Self-pay | Admitting: *Deleted

## 2019-11-18 ENCOUNTER — Other Ambulatory Visit: Payer: Self-pay

## 2019-11-18 ENCOUNTER — Telehealth: Payer: Self-pay | Admitting: General Practice

## 2019-11-18 ENCOUNTER — Encounter: Payer: Self-pay | Admitting: Family Medicine

## 2019-11-18 ENCOUNTER — Ambulatory Visit: Payer: BC Managed Care – PPO | Attending: Family Medicine | Admitting: Family Medicine

## 2019-11-18 VITALS — BP 106/73 | HR 85 | Temp 97.7°F | Ht 63.0 in | Wt 113.0 lb

## 2019-11-18 DIAGNOSIS — R000 Tachycardia, unspecified: Secondary | ICD-10-CM | POA: Diagnosis not present

## 2019-11-18 DIAGNOSIS — R011 Cardiac murmur, unspecified: Secondary | ICD-10-CM

## 2019-11-18 DIAGNOSIS — R05 Cough: Secondary | ICD-10-CM | POA: Diagnosis not present

## 2019-11-18 DIAGNOSIS — J3089 Other allergic rhinitis: Secondary | ICD-10-CM | POA: Diagnosis not present

## 2019-11-18 DIAGNOSIS — R058 Other specified cough: Secondary | ICD-10-CM

## 2019-11-18 MED ORDER — CETIRIZINE HCL 5 MG PO CHEW: 5.0000 mg | CHEWABLE_TABLET | Freq: Every day | ORAL | 3 refills | Status: AC

## 2019-11-18 NOTE — Progress Notes (Signed)
Subjective:  Patient ID: Allison Juarez, female    DOB: 07/29/1985  Age: 34 y.o. MRN: [id number]  CC: New Patient (Initial Visit) (Pt. is here to establish care. Pt. would like to talk to PCP regarding about COVID19 vaccine. )   HPI Allison Juarez, 34 year old female new to the practice, who presents to establish care.  She reports that she has had a recurrent, mostly nonproductive cough for the past 3 years.  She does not smoke cigarettes but does smoke black and mild cigars and reports smoking CBD.  She denies use of a vape pen.  She does have issues with recurrent nasal congestion, clear nasal drainage as well as postnasal drainage.  She denies sore throat or difficulty swallowing.  She denies any recent fever or chills.       She reports that she is also undecided regarding COVID-19 immunization however she requires that this is required by her workplace.  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  Her hesitancy to receive the vaccination lies in the fact that she feels that the vaccination has not been studied sufficiently regarding long-term side effects and that the vaccine was approved too quickly.        She reports past medical history of being told that she had a heart murmur but she has had no recent follow-up regarding this issue.  She reports a history of her mother having had heart disease and passing away before the age of 35.  She also has family history of father with diabetes, father is now deceased.  Maternal grandmother is still alive and has diabetes.  She has had 1 paternal aunt and 1 maternal aunt with breast cancer.  Patient reports history of removal of a noncancerous breast mass in the past and she believes that her mammogram is currently up-to-date and she also reports breast ultrasound earlier this year that indicated regrowth of breast mass but was told that she did not need to have removal of the mass at this time.  She denies any current issues with breast  tenderness or nipple discharge.  She reports no current chest pain or palpitations.  Past Medical History:  Diagnosis Date  . Heart murmur    states no problems, sees no cardiologist  . History of syncope    states gets flushed and has elevated temperature; states has had cardiology workup, and no problems/cause has been identified  . Irregular heart beat   . Nasal congestion 04/14/2014  . 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 BREAST EXCISIONAL BIOPSY Right 2016  . BREAST LUMPECTOMY WITH RADIOACTIVE SEED LOCALIZATION Right 04/20/2014   Procedure: RIGHT BREAST LUMPECTOMY WITH RADIOACTIVE SEED LOCALIZATION;  Surgeon: Autumn Messing III, MD;  Location: [place];  Service: General;  Laterality: Right;  . BREAST SURGERY  04/20/2014   right breast  . NO PAST SURGERIES      Family History  Problem Relation Age of Onset  . Diabetes Mother   . Hypertension Mother   . Heart disease Mother   . Diabetes Father   . Breast cancer Maternal Aunt   . Breast cancer Paternal Aunt     Social History   Tobacco Use  . Smoking status: Current Every Day Smoker    Years: 6.00    Types: Cigars  . Smokeless tobacco: Never Used  . Tobacco comment: 1 Black and Mild/day  Substance Use Topics  .  Alcohol use: Yes    Comment: occasionally    ROS Review of Systems  Constitutional: Positive for fatigue. Negative for chills.  HENT: Positive for congestion, postnasal drip and rhinorrhea. Negative for sinus pressure, sinus pain, sore throat and trouble swallowing.   Respiratory: Positive for cough (recurrent cough non-productive). Negative for shortness of breath.   Gastrointestinal: Negative for abdominal pain, blood in stool, constipation and diarrhea.  Endocrine: Negative for polydipsia, polyphagia and polyuria.  Genitourinary: Negative for dysuria and frequency.  Musculoskeletal: Negative for arthralgias and back pain.  Skin: Negative  for rash and wound.  Neurological: Negative for dizziness and headaches.  Hematological: Negative for adenopathy. Does not bruise/bleed easily.  Psychiatric/Behavioral: Negative for sleep disturbance. The patient is not nervous/anxious.     Objective:   Today's Vitals: BP 106/73 (BP Location: Left Arm, Patient Position: Sitting, Cuff Size: Normal)   Pulse 100   Temp 97.7 F (36.5 C) (Temporal)   Ht 5\' 3"  (1.6 m)   Wt 113 lb (51.3 kg)   LMP 11/08/2019 (Exact Date)   SpO2 96%   BMI 20.02 kg/m   Physical Exam Vitals and nursing note reviewed.  Constitutional:      General: She is not in acute distress.    Appearance: Normal appearance.     Comments: Thin framed female in NAD  HENT:     Ears:     Comments: TM's dull bilaterally with subacute fluid levels    Nose: Congestion and rhinorrhea present.     Comments: Edema/erythema of the nasal turbinates with clear to white nasal discharge    Mouth/Throat:     Pharynx: Posterior oropharyngeal erythema present. No oropharyngeal exudate.     Comments: Posterior pharynx erythema/edema with cobblestoning Neck:     Vascular: No carotid bruit.  Cardiovascular:     Rate and Rhythm: Regular rhythm. Tachycardia present.     Heart sounds: Murmur (faint murmur appreciated when patient was asked to hold her breath during auscultation) heard.      Comments: Borderline tachycardia during exam Pulmonary:     Effort: Pulmonary effort is normal.     Breath sounds: Normal breath sounds. No wheezing.  Abdominal:     Palpations: Abdomen is soft.     Tenderness: There is no abdominal tenderness. There is no right CVA tenderness, left CVA tenderness or guarding.  Musculoskeletal:     Cervical back: Normal range of motion and neck supple.     Right lower leg: No edema.     Left lower leg: No edema.  Lymphadenopathy:     Cervical: No cervical adenopathy.  Skin:    General: Skin is warm and dry.  Neurological:     General: No focal deficit  present.     Mental Status: She is alert and oriented to person, place, and time.     Cranial Nerves: No cranial nerve deficit.     Assessment & Plan:  1. Recurrent cough She reports a history of recurrent cough for more than 3 years.  On examination, patient with evidence of allergic rhinitis which may be contributing to her issues with chronic cough due to postnasal drainage.  Patient reports difficulty swallowing pills and prescription provided for cetirizine 5 mg in the evenings to help with postnasal drainage.  She may increase this to 2 pills daily if needed.  She has also been asked to stop smoking cigars/CBD due to her recurrent cough.  Order also placed for patient to obtain chest x-ray and she  will return to clinic for reevaluation and if she continues to have cough at that time, referral will be placed for patient to be seen by pulmonology. - DG Chest 2 View; Future - cetirizine (ZYRTEC) 5 MG chewable tablet; Chew 1 tablet (5 mg total) by mouth daily. In the evening for cough/congestion  Dispense: 30 tablet; Refill: 3  2. Non-seasonal allergic rhinitis, unspecified trigger Patient with allergic rhinitis which is likely contributing to her issues with chronic cough.  Prescription provided for Zyrtec 5 mg and patient may increase to taking 2 tablets daily if needed for cough and congestion. - cetirizine (ZYRTEC) 5 MG chewable tablet; Chew 1 tablet (5 mg total) by mouth daily. In the evening for cough/congestion  Dispense: 30 tablet; Refill: 3  3. Tachycardia; 4.  Murmur, cardiac Patient initially with mild tachycardia on examination and will check CBC for anemia and BMP for electrolyte abnormality.  She additionally has a slight murmur which she reports has been longstanding but she does not recall prior cardiology evaluation regarding the murmur.  Referral for cardiology evaluation placed. - Ambulatory referral to Cardiology - CBC with Differential - Basic Metabolic Panel  -[id number]  as well as COVID-19 immunization discussed with the patient at today's visit.  Outpatient Encounter Medications as of 11/18/2019  Medication Sig  . oxyCODONE-acetaminophen (ROXICET) 5-325 MG per tablet Take 1-2 tablets by mouth every 4 (four) hours as needed. (Patient not taking: Reported on 10/27/2018)   No facility-administered encounter medications on file as of 11/18/2019.     Follow-up: Return in about 4 weeks (around 12/16/2019) for follow-up cough.   Antony Blackbird MD

## 2019-11-18 NOTE — Telephone Encounter (Signed)
Patient is calling to let provider know that cetirizine (ZYRTEC) 5 MG chewable tablet [[id number]] is not covered by insurance. Out of pocket it is over $100. Is there something cheaper that the patient could take please advise CB- [phone] Preferred [facility] [place]

## 2019-11-19 ENCOUNTER — Encounter: Payer: Self-pay | Admitting: *Deleted

## 2019-11-19 ENCOUNTER — Encounter: Payer: Self-pay | Admitting: Family Medicine

## 2019-11-19 LAB — BASIC METABOLIC PANEL WITH GFR
BUN/Creatinine Ratio: 9 (ref 9–23)
BUN: 7 mg/dL (ref 6–20)
CO2: 26 mmol/L (ref 20–29)
Calcium: 9.8 mg/dL (ref 8.7–10.2)
Chloride: 98 mmol/L (ref 96–106)
Creatinine, Ser: 0.78 mg/dL (ref 0.57–1.00)
GFR calc Af Amer: 114 mL/min/1.73
GFR calc non Af Amer: 99 mL/min/1.73
Glucose: 77 mg/dL (ref 65–99)
Potassium: 4.5 mmol/L (ref 3.5–5.2)
Sodium: 138 mmol/L (ref 134–144)

## 2019-11-19 LAB — CBC WITH DIFFERENTIAL/PLATELET
Basophils Absolute: 0.1 x10E3/uL (ref 0.0–0.2)
Basos: 1 %
EOS (ABSOLUTE): 0.1 x10E3/uL (ref 0.0–0.4)
Eos: 2 %
Hematocrit: 41.9 % (ref 34.0–46.6)
Hemoglobin: 13.5 g/dL (ref 11.1–15.9)
Immature Grans (Abs): 0 x10E3/uL (ref 0.0–0.1)
Immature Granulocytes: 0 %
Lymphocytes Absolute: 3.1 x10E3/uL (ref 0.7–3.1)
Lymphs: 39 %
MCH: 31.5 pg (ref 26.6–33.0)
MCHC: 32.2 g/dL (ref 31.5–35.7)
MCV: 98 fL — ABNORMAL HIGH (ref 79–97)
Monocytes Absolute: 0.5 x10E3/uL (ref 0.1–0.9)
Monocytes: 6 %
Neutrophils Absolute: 4.2 x10E3/uL (ref 1.4–7.0)
Neutrophils: 52 %
Platelets: 346 x10E3/uL (ref 150–450)
RBC: 4.28 x10E6/uL (ref 3.77–5.28)
RDW: 12.8 % (ref 11.7–15.4)
WBC: 8 x10E3/uL (ref 3.4–10.8)

## 2019-11-23 ENCOUNTER — Encounter: Payer: Self-pay | Admitting: Family Medicine

## 2019-11-23 ENCOUNTER — Ambulatory Visit (HOSPITAL_COMMUNITY)
Admission: RE | Admit: 2019-11-23 | Discharge: 2019-11-23 | Disposition: A | Discharge status: Home / Self Care | Payer: BC Managed Care – PPO | Source: Ambulatory Visit | Attending: Family Medicine | Admitting: Family Medicine

## 2019-11-23 ENCOUNTER — Other Ambulatory Visit: Payer: Self-pay

## 2019-11-23 DIAGNOSIS — R05 Cough: Secondary | ICD-10-CM | POA: Insufficient documentation

## 2019-11-23 DIAGNOSIS — R058 Other specified cough: Secondary | ICD-10-CM

## 2019-11-23 NOTE — Telephone Encounter (Signed)
Called pt message given to the patient!

## 2019-11-24 ENCOUNTER — Encounter: Payer: Self-pay | Admitting: *Deleted

## 2019-11-24 NOTE — Telephone Encounter (Signed)
If she has a Higher education careers adviser at [employer] or [employer] she should be able to get the medication at a cheaper cost. Otherwise she can try otc benadryl at bedtime and take over the counter loratadine 10 mg once in the morning to help with congestion

## 2019-11-26 NOTE — Telephone Encounter (Signed)
ATC pt x 1, no answer, LM to [facility] regarding provider's advice about medication

## 2019-12-11 ENCOUNTER — Encounter: Payer: Self-pay | Admitting: Family Medicine

## 2019-12-15 ENCOUNTER — Other Ambulatory Visit: Payer: Self-pay

## 2019-12-15 ENCOUNTER — Ambulatory Visit: Payer: BC Managed Care – PPO | Attending: Family Medicine | Admitting: Family Medicine

## 2019-12-17 ENCOUNTER — Ambulatory Visit: Payer: BC Managed Care – PPO | Admitting: Internal Medicine

## 2019-12-17 NOTE — Progress Notes (Deleted)
Cardiology Office Note:    Date:  12/17/2019   ID:  Allison Juarez, DOB 1985-12-23, MRN [id number]  PCP:  Antony Blackbird, MD   Referring MD: Antony Blackbird, MD   CC: Consulted for the evaluation of heart murmur and tachycardia at the behest of Antony Blackbird, MD   History of Present Illness:    Allison Juarez is a 34 y.o. female with a hx of ***  Past Medical History:  Diagnosis Date  . Heart murmur    states no problems, sees no cardiologist  . History of syncope    states gets flushed and has elevated temperature; states has had cardiology workup, and no problems/cause has been identified  . Irregular heart beat   . Nasal congestion 04/14/2014  . 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 BREAST EXCISIONAL BIOPSY Right 2016  . BREAST LUMPECTOMY WITH RADIOACTIVE SEED LOCALIZATION Right 04/20/2014   Procedure: RIGHT BREAST LUMPECTOMY WITH RADIOACTIVE SEED LOCALIZATION;  Surgeon: Autumn Messing III, MD;  Location: [place];  Service: General;  Laterality: Right;  . BREAST SURGERY  04/20/2014   right breast    Current Medications: No outpatient medications have been marked as taking for the 12/17/19 encounter (Appointment) with Werner Lean, MD.     Allergies:   Patient has no known allergies.   Social History   Socioeconomic History  . Marital status: Single    Spouse name: Not on file  . Number of children: 0  . Years of education: Not on file  . Highest education level: Associate degree: academic program  Occupational History  . Not on file  Tobacco Use  . Smoking status: Current Every Day Smoker    Years: 6.00    Types: Cigars  . Smokeless tobacco: Never Used  . Tobacco comment: 1 Black and Mild/day  Vaping Use  . Vaping Use: Never used  Substance and Sexual Activity  . Alcohol use: Yes    Comment: occasionally  . Drug use: Not Currently  . Sexual activity: Not Currently    Birth  control/protection: None  Other Topics Concern  . Not on file  Social History Narrative  . Not on file   Social Determinants of Health   Financial Resource Strain:   . Difficulty of Paying Living Expenses: Not on file  Food Insecurity:   . Worried About Charity fundraiser in the Last Year: Not on file  . Ran Out of Food in the Last Year: Not on file  Transportation Needs:   . Lack of Transportation (Medical): Not on file  . Lack of Transportation (Non-Medical): Not on file  Physical Activity:   . Days of Exercise per Week: Not on file  . Minutes of Exercise per Session: Not on file  Stress:   . Feeling of Stress : Not on file  Social Connections:   . Frequency of Communication with Friends and Family: Not on file  . Frequency of Social Gatherings with Friends and Family: Not on file  . Attends Religious Services: Not on file  . Active Member of Clubs or Organizations: Not on file  . Attends Archivist Meetings: Not on file  . Marital Status: Not on file     Family History: The patient's ***family history includes Breast cancer in her maternal aunt and paternal aunt; Diabetes in her father and maternal grandmother; Heart disease in her mother; Hypertension in her mother.  ROS:  Please see the history of present illness.    *** All other systems reviewed and are negative.  EKGs/Labs/Other Studies Reviewed:    The following studies were reviewed today: ***  EKG:  EKG is *** ordered today.  The ekg ordered today demonstrates ***  Recent Labs: 11/18/2019: BUN 7; Creatinine, Ser 0.78; Hemoglobin 13.5; Platelets 346; Potassium 4.5; Sodium 138  Recent Lipid Panel No results found for: CHOL, TRIG, HDL, CHOLHDL, VLDL, LDLCALC, LDLDIRECT  Physical Exam:    VS:  There were no vitals taken for this visit.    Wt Readings from Last 3 Encounters:  11/18/19 113 lb (51.3 kg)  10/27/18 111 lb (50.3 kg)  11/26/16 130 lb (59 kg)     GEN: *** Well nourished, well  developed in no acute distress HEENT: Normal NECK: No JVD; No carotid bruits LYMPHATICS: No lymphadenopathy CARDIAC: ***RRR, no murmurs, rubs, gallops RESPIRATORY:  Clear to auscultation without rales, wheezing or rhonchi  ABDOMEN: Soft, non-tender, non-distended MUSCULOSKELETAL:  No edema; No deformity  SKIN: Warm and dry NEUROLOGIC:  Alert and oriented x 3 PSYCHIATRIC:  Normal affect   ASSESSMENT:    No diagnosis found. PLAN:    In order of problems listed above:  1. ***   Medication Adjustments/Labs and Tests Ordered: Current medicines are reviewed at length with the patient today.  Concerns regarding medicines are outlined above.  No orders of the defined types were placed in this encounter.  No orders of the defined types were placed in this encounter.   There are no Patient Instructions on file for this visit.   Signed, Werner Lean, MD  12/17/2019 7:55 AM    [place]

## 2019-12-20 ENCOUNTER — Ambulatory Visit: Payer: No Typology Code available for payment source | Admitting: Family Medicine

## 2020-01-07 ENCOUNTER — Ambulatory Visit (INDEPENDENT_AMBULATORY_CARE_PROVIDER_SITE_OTHER): Payer: BC Managed Care – PPO | Admitting: Cardiology

## 2020-01-07 ENCOUNTER — Encounter: Payer: Self-pay | Admitting: Cardiology

## 2020-01-07 ENCOUNTER — Other Ambulatory Visit: Payer: Self-pay

## 2020-01-07 VITALS — BP 113/77 | HR 84 | Ht 63.0 in | Wt 118.0 lb

## 2020-01-07 DIAGNOSIS — R011 Cardiac murmur, unspecified: Secondary | ICD-10-CM

## 2020-01-07 DIAGNOSIS — Z87898 Personal history of other specified conditions: Secondary | ICD-10-CM

## 2020-01-07 DIAGNOSIS — Z7189 Other specified counseling: Secondary | ICD-10-CM | POA: Diagnosis not present

## 2020-01-07 DIAGNOSIS — Z716 Tobacco abuse counseling: Secondary | ICD-10-CM | POA: Diagnosis not present

## 2020-01-07 DIAGNOSIS — Z8249 Family history of ischemic heart disease and other diseases of the circulatory system: Secondary | ICD-10-CM

## 2020-01-07 DIAGNOSIS — R0789 Other chest pain: Secondary | ICD-10-CM

## 2020-01-07 NOTE — Progress Notes (Signed)
Cardiology Office Note:    Date:  01/07/2020   ID:  Allison Juarez, DOB 08/16/1985, MRN [id number]  PCP:  Antony Blackbird, MD  Cardiologist:  Buford Dresser, MD  Referring MD: Antony Blackbird, MD   CC: new patient consultation for tachycardia and heart murmur  History of Present Illness:    Allison Juarez is a 34 y.o. female without prior clear cardiac history who is seen as a new consult at the request of [name], [name], MD for the evaluation and management of tachycardia and heart murmur.  Per note from Dr. [name] dated 11/18/19, noted to have been told previously that she has a heart murmur. Also reported that mother had heart disease, died before she was 8. Also noted to have borderline tachycardia at 100 bpm. Referred to cardiology for further evaluation.  Today: Has episodes of chest tightness, random. Better if she lays down. Happens about once a week. Can last for hours. Nothing seems to make it worse, going to sleep makes it better. Gets a little clammy with these episodes.  Had syncope in the past, wore a monitor years ago in [place]. Had a lumbar puncture as well. Never found out what was causing it. No episodes in >6 months. Has prodrome, feels flushed/warm, lightheaded, short of breath.   Cardiovascular risk factors: Prior clinical ASCVD: none Comorbid conditions: Denies hypertension, hyperlipidemia, diabetes, chronic kidney disease: Metabolic syndrome/Obesity: BMI 20 Chronic inflammatory conditions: none Tobacco use history: working to cut back/quit. Off cigarettes, but smoked occasional black & mild/CBD. Family history: Mother was 62 at the time of death, had massive MI. No other history that she knows of, as she was brought up in the foster system Prior cardiac testing and/or incidental findings on other testing (ie coronary calcium): none Exercise level: no limitations, can climb stairs, etc  Was told she has had a murmur, told she should have it monitored.   Past  Medical History:  Diagnosis Date  . Heart murmur    states no problems, sees no cardiologist  . History of syncope    states gets flushed and has elevated temperature; states has had cardiology workup, and no problems/cause has been identified  . Irregular heart beat   . Nasal congestion 04/14/2014  . 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 BREAST EXCISIONAL BIOPSY Right 2016  . BREAST LUMPECTOMY WITH RADIOACTIVE SEED LOCALIZATION Right 04/20/2014   Procedure: RIGHT BREAST LUMPECTOMY WITH RADIOACTIVE SEED LOCALIZATION;  Surgeon: Autumn Messing III, MD;  Location: [facility];  Service: General;  Laterality: Right;  . BREAST SURGERY  04/20/2014   right breast    Current Medications: Current Outpatient Medications on File Prior to Visit  Medication Sig  . cetirizine (ZYRTEC) 5 MG chewable tablet Chew 1 tablet (5 mg total) by mouth daily. In the evening for cough/congestion   No current facility-administered medications on file prior to visit.     Allergies:   Patient has no known allergies.   Social History   Tobacco Use  . Smoking status: Current Every Day Smoker    Years: 6.00    Types: Cigars  . Smokeless tobacco: Never Used  . Tobacco comment: 1 Black and Mild/day  Vaping Use  . Vaping Use: Never used  Substance Use Topics  . Alcohol use: Yes    Comment: occasionally  . Drug use: Not Currently    Family History: family history includes Breast cancer in her maternal aunt and  paternal aunt; Diabetes in her father and maternal grandmother; Heart disease in her mother; Hypertension in her mother.  ROS:   Please see the history of present illness.  Additional pertinent ROS: Constitutional: Negative for chills, fever, night sweats, unintentional weight loss  HENT: Negative for ear pain and hearing loss.   Eyes: Negative for loss of vision and eye pain.  Respiratory: Negative for cough, sputum, wheezing.     Cardiovascular: See HPI. Gastrointestinal: Negative for abdominal pain, melena, and hematochezia.  Genitourinary: Negative for dysuria and hematuria.  Musculoskeletal: Negative for falls and myalgias.  Skin: Negative for itching and rash.  Neurological: Negative for focal weakness, focal sensory changes and loss of consciousness.  Endo/Heme/Allergies: Does not bruise/bleed easily.     EKGs/Labs/Other Studies Reviewed:    The following studies were reviewed today: No prior studies available  EKG:  EKG is personally reviewed.  The ekg ordered today demonstrates sinus rhythm with sinus arrhythmia at 84 bpm  Recent Labs: 11/18/2019: BUN 7; Creatinine, Ser 0.78; Hemoglobin 13.5; Platelets 346; Potassium 4.5; Sodium 138  Recent Lipid Panel No results found for: CHOL, TRIG, HDL, CHOLHDL, VLDL, LDLCALC, LDLDIRECT  Physical Exam:    VS:  BP 113/77   Pulse 84   Ht 5\' 3"  (1.6 m)   Wt 118 lb (53.5 kg)   SpO2 100%   BMI 20.90 kg/m     Wt Readings from Last 3 Encounters:  01/07/20 118 lb (53.5 kg)  11/18/19 113 lb (51.3 kg)  10/27/18 111 lb (50.3 kg)    GEN: Well nourished, well developed in no acute distress HEENT: Normal, moist mucous membranes NECK: No JVD CARDIAC: regular rhythm, normal S1 and S2, no rubs or gallops. 1/6 systolic murmur. VASCULAR: Radial and DP pulses 2+ bilaterally. No carotid bruits RESPIRATORY:  Clear to auscultation without rales, wheezing or rhonchi  ABDOMEN: Soft, non-tender, non-distended MUSCULOSKELETAL:  Ambulates independently SKIN: Warm and dry, no edema NEUROLOGIC:  Alert and oriented x 3. No focal neuro deficits noted. PSYCHIATRIC:  Normal affect    ASSESSMENT:    1. Murmur   2. Tobacco abuse counseling   3. Family history of heart disease   4. Cardiac risk counseling   5. Counseling on health promotion and disease prevention   6. Other chest pain   7. History of syncope    PLAN:    Murmur Atypical chest pain Prior history of  syncope Premature CVD in mother -will get echocardiogram for further evaluation  History of tachycardia -ECG today within normal limits for age  Tobacco cessation: The patient was counseled on tobacco cessation today for 3 minutes.  Counseling included reviewing the risks of smoking tobacco products, how it impacts the patient's current medical diagnoses and different strategies for quitting.  Pharmacotherapy to aid in tobacco cessation was not prescribed today.  Cardiac risk counseling and prevention recommendations: -recommend heart healthy/Mediterranean diet, with whole grains, fruits, vegetable, fish, lean meats, nuts, and olive oil. Limit salt. -recommend moderate walking, 3-5 times/week for 30-50 minutes each session. Aim for at least 150 minutes.week. Goal should be pace of 3 miles/hours, or walking 1.5 miles in 30 minutes -recommend avoidance of tobacco products. Avoid excess alcohol. -ASCVD risk score: The ASCVD Risk score Mikey Bussing DC Jr., et al., 2013) failed to calculate for the following reasons:   The 2013 ASCVD risk score is only valid for ages 41 to 62    Plan for follow up: if echo unremarkable, can follow up as needed  Buford Dresser,  MD, PhD [place]  [facility]    Medication Adjustments/Labs and Tests Ordered: Current medicines are reviewed at length with the patient today.  Concerns regarding medicines are outlined above.  Orders Placed This Encounter  Procedures  . EKG 12-Lead  . ECHOCARDIOGRAM COMPLETE   No orders of the defined types were placed in this encounter.   Patient Instructions  Medication Instructions:  Your Physician recommend you continue on your current medication as directed.    *If you need a refill on your cardiac medications before your next appointment, please call your pharmacy*   Lab Work: None ordered   Testing/Procedures: Your physician has requested that you have an echocardiogram. Echocardiography is a painless  Juarez that uses sound waves to create images of your heart. It provides your doctor with information about the size and shape of your heart and how well your heart's chambers and valves are working. This procedure takes approximately one hour. There are no restrictions for this procedure. [place] [address]    Follow-Up: At [facility], you and your health needs are our priority.  As part of our continuing mission to provide you with exceptional heart care, we have created designated Provider Care Teams.  These Care Teams include your primary Cardiologist (physician) and Advanced Practice Providers (APPs -  Physician Assistants and Nurse Practitioners) who all work together to provide you with the care you need, when you need it.  We recommend signing up for the patient portal called "MyChart".  Sign up information is provided on this After Visit Summary.  MyChart is used to connect with patients for Virtual Visits (Telemedicine).  Patients are able to view lab/Juarez results, encounter notes, upcoming appointments, etc.  Non-urgent messages can be sent to your provider as well.   To learn more about what you can do with MyChart, go to [email or web address].    Your next appointment:   As needed  The format for your next appointment:   In Person  Provider:   Buford Dresser, MD       Signed, Buford Dresser, MD PhD 01/07/2020  [place]

## 2020-01-07 NOTE — Patient Instructions (Addendum)
Medication Instructions:  Your Physician recommend you continue on your current medication as directed.    *If you need a refill on your cardiac medications before your next appointment, please call your pharmacy*   Lab Work: None ordered   Testing/Procedures: Your physician has requested that you have an echocardiogram. Echocardiography is a painless test that uses sound waves to create images of your heart. It provides your doctor with information about the size and shape of your heart and how well your heart's chambers and valves are working. This procedure takes approximately one hour. There are no restrictions for this procedure. [place] [address]    Follow-Up: At [facility], you and your health needs are our priority.  As part of our continuing mission to provide you with exceptional heart care, we have created designated Provider Care Teams.  These Care Teams include your primary Cardiologist (physician) and Advanced Practice Providers (APPs -  Physician Assistants and Nurse Practitioners) who all work together to provide you with the care you need, when you need it.  We recommend signing up for the patient portal called "MyChart".  Sign up information is provided on this After Visit Summary.  MyChart is used to connect with patients for Virtual Visits (Telemedicine).  Patients are able to view lab/test results, encounter notes, upcoming appointments, etc.  Non-urgent messages can be sent to your provider as well.   To learn more about what you can do with MyChart, go to [email or web address].    Your next appointment:   As needed  The format for your next appointment:   In Person  Provider:   Buford Dresser, MD

## 2020-01-14 DIAGNOSIS — Z8249 Family history of ischemic heart disease and other diseases of the circulatory system: Secondary | ICD-10-CM | POA: Insufficient documentation

## 2020-01-14 DIAGNOSIS — Z87898 Personal history of other specified conditions: Secondary | ICD-10-CM | POA: Insufficient documentation

## 2020-01-14 DIAGNOSIS — R011 Cardiac murmur, unspecified: Secondary | ICD-10-CM | POA: Insufficient documentation

## 2020-01-25 ENCOUNTER — Other Ambulatory Visit: Payer: Self-pay

## 2020-01-25 ENCOUNTER — Ambulatory Visit (HOSPITAL_COMMUNITY): Payer: BC Managed Care – PPO | Attending: Cardiology

## 2020-01-25 DIAGNOSIS — R011 Cardiac murmur, unspecified: Secondary | ICD-10-CM | POA: Diagnosis present

## 2020-01-25 LAB — ECHOCARDIOGRAM COMPLETE
Area-P 1/2: 3.41 cm2
S' Lateral: 3.3 cm

## 2020-02-02 ENCOUNTER — Encounter: Payer: Self-pay | Admitting: Family Medicine

## 2020-02-02 ENCOUNTER — Other Ambulatory Visit: Payer: Self-pay

## 2020-02-02 ENCOUNTER — Ambulatory Visit: Payer: BC Managed Care – PPO | Attending: Family Medicine | Admitting: Family Medicine

## 2020-02-02 DIAGNOSIS — R011 Cardiac murmur, unspecified: Secondary | ICD-10-CM | POA: Diagnosis not present

## 2020-02-02 NOTE — Progress Notes (Signed)
Visit to discuss ECHO results

## 2020-02-02 NOTE — Progress Notes (Signed)
   Virtual Visit via Telephone Note  I connected with Allison Juarez on 02/02/20 at  8:30 AM EST by telephone and verified that I am speaking with the correct person using two identifiers.   I discussed the limitations, risks, security and privacy concerns of performing an evaluation and management service by telephone and the availability of in person appointments. I also discussed with the patient that there may be a patient responsible charge related to this service. The patient expressed understanding and agreed to proceed.  Patient Location: Home Provider Location: [facility] office Others participating in call: none   History of Present Illness:        34 year old female, who is status post recent cardiology visit on 01/07/2020 in follow-up of murmur and patient would like to know the results of her echocardiogram.  Patient denies any other issues at today's visit.   Past Medical History:  Diagnosis Date  . Heart murmur    states no problems, sees no cardiologist  . History of syncope    states gets flushed and has elevated temperature; states has had cardiology workup, and no problems/cause has been identified  . Irregular heart beat   . Nasal congestion 04/14/2014  . 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 BREAST EXCISIONAL BIOPSY Right 2016  . BREAST LUMPECTOMY WITH RADIOACTIVE SEED LOCALIZATION Right 04/20/2014   Procedure: RIGHT BREAST LUMPECTOMY WITH RADIOACTIVE SEED LOCALIZATION;  Surgeon: Autumn Messing III, MD;  Location: [facility];  Service: General;  Laterality: Right;  . BREAST SURGERY  04/20/2014   right breast    Family History  Problem Relation Age of Onset  . Hypertension Mother   . Heart disease Mother   . Diabetes Father   . Breast cancer Maternal Aunt   . Breast cancer Paternal Aunt   . Diabetes Maternal Grandmother     Social History   Tobacco Use  . Smoking status: Current Every Day Smoker     Years: 6.00    Types: Cigars  . Smokeless tobacco: Never Used  . Tobacco comment: 1 Black and Mild/day  Vaping Use  . Vaping Use: Never used  Substance Use Topics  . Alcohol use: Yes    Comment: occasionally  . Drug use: Not Currently     No Known Allergies     Observations/Objective: No vital signs or physical exam conducted as visit was done via telephone  Assessment and Plan: 1. Murmur, cardiac Patient was given the information as per cardiology regarding results of the echocardiogram.  Also notified that her ejection fraction of 50 to 55% was normal.  Patient should be contacted today or sometime this week by cardiology with the results and patient should ask the person to contact her regarding any further questions pertaining to her echocardiogram.  Follow Up Instructions:Return for Chronic issues; follow-up as needed or keep any scheduled appointments.    I discussed the assessment and treatment plan with the patient. The patient was provided an opportunity to ask questions and all were answered. The patient agreed with the plan and demonstrated an understanding of the instructions.   The patient was advised to call back or seek an in-person evaluation if the symptoms worsen or if the condition fails to improve as anticipated.  I provided 5 minutes of non-face-to-face time during this encounter.   Antony Blackbird, MD
